# Patient Record
Sex: Female | Born: 1940 | Race: White | Hispanic: No | Marital: Married | Smoking: Former smoker
Health system: Southern US, Community
[De-identification: ages and names within clinical notes are randomized; demographics above are authoritative.]

## PROBLEM LIST (undated history)

## (undated) DIAGNOSIS — D649 Anemia, unspecified: Secondary | ICD-10-CM

## (undated) DIAGNOSIS — I10 Essential (primary) hypertension: Secondary | ICD-10-CM

## (undated) DIAGNOSIS — E039 Hypothyroidism, unspecified: Secondary | ICD-10-CM

## (undated) DIAGNOSIS — K449 Diaphragmatic hernia without obstruction or gangrene: Secondary | ICD-10-CM

## (undated) DIAGNOSIS — Z86010 Personal history of colonic polyps: Secondary | ICD-10-CM

## (undated) HISTORY — DX: Anemia, unspecified: D64.9

## (undated) HISTORY — PX: OTHER SURGICAL HISTORY: SHX169

## (undated) HISTORY — PX: BLADDER SURGERY: SHX569

## (undated) HISTORY — DX: Essential (primary) hypertension: I10

## (undated) HISTORY — DX: Personal history of colonic polyps: Z86.010

## (undated) HISTORY — DX: Diaphragmatic hernia without obstruction or gangrene: K44.9

## (undated) HISTORY — PX: FOOT SURGERY: SHX648

## (undated) HISTORY — PX: TUBAL LIGATION: SHX77

## (undated) HISTORY — DX: Hypothyroidism, unspecified: E03.9

## (undated) HISTORY — PX: COLON RESECTION: SHX5231

## (undated) HISTORY — PX: ABDOMINAL HYSTERECTOMY: SHX81

---

## 2009-05-01 HISTORY — PX: ESOPHAGOGASTRODUODENOSCOPY: SHX1529

## 2016-02-18 HISTORY — PX: COLONOSCOPY: SHX174

## 2018-08-22 ENCOUNTER — Telehealth: Payer: Self-pay | Admitting: Gastroenterology

## 2018-08-22 NOTE — Telephone Encounter (Signed)
Dr.Gupta reviewed patient's records and accepted to see patient for a virtual office visit. Left message for patient to call back and schedule an appointment.

## 2018-08-22 NOTE — Telephone Encounter (Signed)
Records will be placed on Dr.Gupta's desk for review.  °

## 2018-08-29 ENCOUNTER — Encounter: Payer: Self-pay | Admitting: *Deleted

## 2018-08-30 ENCOUNTER — Encounter: Payer: Self-pay | Admitting: Gastroenterology

## 2018-08-30 ENCOUNTER — Telehealth (INDEPENDENT_AMBULATORY_CARE_PROVIDER_SITE_OTHER): Payer: Medicare Other | Admitting: Gastroenterology

## 2018-08-30 VITALS — Ht 64.5 in | Wt 160.0 lb

## 2018-08-30 DIAGNOSIS — K449 Diaphragmatic hernia without obstruction or gangrene: Secondary | ICD-10-CM

## 2018-08-30 DIAGNOSIS — R0602 Shortness of breath: Secondary | ICD-10-CM

## 2018-08-30 DIAGNOSIS — Z862 Personal history of diseases of the blood and blood-forming organs and certain disorders involving the immune mechanism: Secondary | ICD-10-CM

## 2018-08-30 DIAGNOSIS — K922 Gastrointestinal hemorrhage, unspecified: Secondary | ICD-10-CM

## 2018-08-30 DIAGNOSIS — K219 Gastro-esophageal reflux disease without esophagitis: Secondary | ICD-10-CM

## 2018-08-30 MED ORDER — PANTOPRAZOLE SODIUM 40 MG PO TBEC: 40.0000 mg | DELAYED_RELEASE_TABLET | Freq: Every day | ORAL | 11 refills | Status: DC

## 2018-08-30 NOTE — Patient Instructions (Addendum)
Protonix 40mg  every morning ( Medications sent to your pharmacy )  Continue Zantac 150mg  po every night at bedtime ( you already have this medication)   Raise head of bed by 6" blocks  I do recommend CT of the chest with contrast.  Patient will get in touch with Dr. Andrey Campanile.  FU tele-visit in 4 weeks.  If still with problems and if she wants to pursue hiatal hernia repair, then would recommend esophageal manometry followed by surgical consultation.  I have discussed risks and benefits.  Certainly would like to avoid surgery if possible.  Thank you for choosing [facility]  Dr Chales Abrahams

## 2018-08-30 NOTE — Progress Notes (Signed)
Chief Complaint: GERD/SOB  Referring Provider:  Georganna Skeans, MD      ASSESSMENT AND PLAN;   #1. GERD with large HH. UGI series 05/2018- large HH with reflux. No volvulus. EGD 05/01/2009-large hiatal hernia, neg SB Bx (Dr Arlyce Dice at [place])  #2. SOB. Neg PFTs, cardiac stress test 02/2018. Had pul nodules in past.  #3. H/O anemia (resolved).   Plan: - Protonix 40mg  QAM. - Zantac 150mg  po QHS. - Raise HOB by 6" blocks - I do recommend CT of the chest with contrast.  Patient will get in touch with Dr. Andrey Campanile. - FU tele-visit in 4 weeks. - If still with problems and if she wants to pursue hiatal hernia repair, then would recommend esophageal manometry followed by surgical consultation for fundoplication.  I have discussed risks and benefits.  Certainly would like to avoid surgery if possible. 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  No odynophagia or dysphagia.  No weight loss.  No melena or hematochezia. Main complaint is shortness of breath especially when she walks fast.  Had normal pulmonary function tests, negative echo and stress test.  Told that it could be because of large hiatal hernia and reflux. No nausea, vomiting, odynophagia or dysphagia.  No significant diarrhea or constipation.   History of anemia in 2014 with hemoglobin of 6.5, heme positive stools.  Underwent EGD and colonoscopy at that time which was negative except for large hiatal hernia and small colonic polyps.  Did not have anemia thereafter.  Most recent hemoglobin 13.6 (jan 2020).  She has been taking iron supplements 3/week.  Past GI procedures: -Colonoscopy 02/22/2016 ([facility] Kirtland Bouchard [facility]1 small 5 mm polyp status post polypectomy, internal hemorrhoids, Bx- TA, 01/24/2013-3 small polyps, diverticulosis. Bx-hyperplastic. Past Medical History:  Diagnosis Date  . Anemia   . History of colon polyps   . Hypertension   . Hypothyroidism      Past Surgical History:  Procedure Laterality Date  . ABDOMINAL HYSTERECTOMY    . abdominal wall hernia    . BLADDER SURGERY    . COLON RESECTION    . COLONOSCOPY  02/18/2016  . ESOPHAGOGASTRODUODENOSCOPY  05/01/2009  . FOOT SURGERY    . TUBAL LIGATION      Family History  Problem Relation Age of Onset  . Ovarian cancer Mother   . Breast cancer Maternal Aunt     Social History   Tobacco Use  . Smoking status: Former Smoker    Types: Cigarettes    Last attempt to quit: 1969    Years since quitting: 51.3  . Smokeless tobacco: Never Used  Substance Use Topics  . Alcohol use: Yes    Comment: OCCASIONALLY  . Drug use: Never    Current Outpatient Medications  Medication Sig Dispense Refill  . Ascorbic Acid (VITAMIN C WITH ROSE HIPS) 1000 MG tablet Take 1,000 mg by mouth 2 (two) times daily.    Marland Kitchen atorvastatin (LIPITOR) 10 MG tablet Take 10 mg by mouth daily.    Marland Kitchen b complex vitamins tablet Take 1 tablet by mouth daily.    . Calcium-Phosphorus-Vitamin D (CITRACAL +D3 PO) Take by mouth 2 (two) times daily.    . diphenhydrAMINE (BENADRYL) 50 MG tablet Take 50 mg by mouth at bedtime as needed for sleep.     . Ferrous Sulfate (IRON) 325 (65 Fe) MG TABS Take by mouth. Three times a week    .  fluticasone (FLONASE) 50 MCG/ACT nasal spray Place 2 sprays into both nostrils daily.    Marland Kitchen. lisinopril (PRINIVIL,ZESTRIL) 40 MG tablet Take 40 mg by mouth daily.    . magnesium oxide (MAG-OX) 400 MG tablet Take 400 mg by mouth daily.    . Melatonin 300 MCG TABS Take by mouth. At bedtime    . metoprolol succinate (TOPROL-XL) 50 MG 24 hr tablet Take 50 mg by mouth daily. Take with or immediately following a meal.    . pantoprazole (PROTONIX) 40 MG tablet Take 40 mg by mouth daily.    . risedronate (ACTONEL) 150 MG tablet Take 150 mg by mouth every 30 (thirty) days. with water on empty stomach, nothing by mouth or lie down for next 30 minutes.    Marland Kitchen. thyroid (ARMOUR) 90 MG tablet Take 90 mg by  mouth daily.    . Vitamin A 2400 MCG (8000 UT) CAPS Take by mouth.    . vitamin E 400 UNIT capsule Take 400 Units by mouth daily.     No current facility-administered medications for this visit.     Allergies  Allergen Reactions  . Codeine   . Macrobid [Nitrofurantoin]   . Penicillins   . Tetracyclines & Related     Review of Systems:  Constitutional: Denies fever, chills, diaphoresis, appetite change and fatigue.  HEENT: Denies photophobia, eye pain, redness, hearing loss, ear pain, congestion, sore throat, rhinorrhea, sneezing, mouth sores, neck pain, neck stiffness and tinnitus.   Respiratory: Has SOB, DOE, No cough, chest tightness,  and wheezing.   Cardiovascular: Denies chest pain, palpitations and leg swelling.  Genitourinary: Denies dysuria, urgency, frequency, hematuria, flank pain and difficulty urinating.  Musculoskeletal: Denies myalgias, back pain, joint swelling, arthralgias and gait problem.  Skin: No rash.  Neurological: Denies dizziness, seizures, syncope, weakness, light-headedness, numbness and headaches.  Hematological: Denies adenopathy. Easy bruising, personal or family bleeding history  Psychiatric/Behavioral: No anxiety or depression     Physical Exam:    Ht 5' 4.5" (1.638 m)   Wt 160 lb (72.6 kg)   BMI 27.04 kg/m  Filed Weights   08/30/18 1002  Weight: 160 lb (72.6 kg)  Not examined since it was a tele-visit  Data Reviewed: I have personally reviewed following labs and imaging studies Normal CBC with hemoglobin 13.6, MCV 96 (January 2020) Ferritin 11.8, iron 80, saturation 23%   This service was provided via telemedicine.  The patient was located at home.  The provider was located in office.  The patient did consent to this telephone visit and is aware of possible charges through their insurance for this visit.   Time spent on call and coordination of care/review of records: 45 min   Edman Circleaj Tinsleigh Slovacek, MD 08/30/2018, 10:12 AM  Cc: Georganna SkeansWilson, Amelia, MD

## 2018-09-11 ENCOUNTER — Other Ambulatory Visit: Payer: Self-pay

## 2018-09-11 ENCOUNTER — Telehealth: Payer: Self-pay | Admitting: Gastroenterology

## 2018-09-11 DIAGNOSIS — R911 Solitary pulmonary nodule: Secondary | ICD-10-CM

## 2018-09-11 NOTE — Telephone Encounter (Signed)
Patient scheduled for CT of Chest without contrast at [facility]. 10/16/18 at 11:30 am. Left message for patient to call back.

## 2018-09-11 NOTE — Telephone Encounter (Signed)
Lets do CT of the chest (without contrast) RE: follow-up of pulmonary nodules

## 2018-09-12 ENCOUNTER — Telehealth: Payer: Self-pay

## 2018-09-12 NOTE — Progress Notes (Signed)
You have been scheduled for a CT scan of the Chest WITHOUT contrast at [facility].  [place], [place]  [address]  You are scheduled on 10/16/18 at 11:30am. You should arrive 15 minutes prior to your appointment time for registration. Please follow the written instructions below on the day of your exam:  WARNING: IF YOU ARE ALLERGIC TO IODINE/X-RAY DYE, PLEASE NOTIFY RADIOLOGY IMMEDIATELY AT [phone]! YOU WILL BE GIVEN A 13 HOUR PREMEDICATION PREP.  1) Do not eat or drink anything after 9:30am (2 hours prior to your test)   You may take any medications as prescribed with a small amount of water, if necessary. If you take any of the following medications: METFORMIN, GLUCOPHAGE, GLUCOVANCE, AVANDAMET, RIOMET, FORTAMET, ACTOPLUS MET, JANUMET, GLUMETZA or METAGLIP, you MAY be asked to HOLD this medication 48 hours AFTER the exam.  This test typically takes 30-45 minutes to complete.  If you have any questions regarding your exam or if you need to reschedule, you may call the CT department at [phone] between the hours of 8:00 am and 5:00 pm, Monday-Friday.   [facility] Dr. Lyndel Safe  ________________________________________________________________________

## 2018-09-12 NOTE — Progress Notes (Signed)
Dear Maye Hides:    You have been scheduled for a CT scan of the Chest WITHOUT contrast at [facility].  [place], [place]   [address]   You are scheduled on 10/16/18 at 11:30am. You should arrive 15 minutes prior to your appointment time for registration. Please follow the written instructions below on the day of your exam:   1) Do not eat or drink anything after 9:30am (2 hours prior to your test)  You may take any medications as prescribed with a small amount of water, if necessary. If you take any of the following medications: METFORMIN, GLUCOPHAGE, GLUCOVANCE, AVANDAMET, RIOMET, FORTAMET, ACTOPLUS MET, JANUMET, GLUMETZA or METAGLIP, you MAY be asked to HOLD this medication 48 hours AFTER the exam'  This test typically takes 30-45 minutes to complete.  If you have any questions regarding your exam or if you need to reschedule, you may call the CT department at [phone]  between the hours of 8:00 am and 5:00 pm, Monday-Friday.   [facility] Dr. Lyndel Safe ________________________________________________________________________

## 2018-09-12 NOTE — Telephone Encounter (Signed)
Called patient multiple times and left message to call back. Letter sent

## 2018-10-02 ENCOUNTER — Telehealth (INDEPENDENT_AMBULATORY_CARE_PROVIDER_SITE_OTHER): Payer: Medicare Other | Admitting: Gastroenterology

## 2018-10-02 ENCOUNTER — Other Ambulatory Visit: Payer: Self-pay

## 2018-10-02 ENCOUNTER — Encounter: Payer: Self-pay | Admitting: Gastroenterology

## 2018-10-02 VITALS — Ht 64.5 in | Wt 158.0 lb

## 2018-10-02 DIAGNOSIS — K219 Gastro-esophageal reflux disease without esophagitis: Secondary | ICD-10-CM

## 2018-10-02 DIAGNOSIS — R911 Solitary pulmonary nodule: Secondary | ICD-10-CM

## 2018-10-02 DIAGNOSIS — K449 Diaphragmatic hernia without obstruction or gangrene: Secondary | ICD-10-CM

## 2018-10-02 MED ORDER — PANTOPRAZOLE SODIUM 40 MG PO TBEC: 40.0000 mg | DELAYED_RELEASE_TABLET | Freq: Two times a day (BID) | ORAL | 4 refills | Status: DC

## 2018-10-02 NOTE — Addendum Note (Signed)
Addended by: Oliver Barre D on: 10/02/2018 02:00 PM   Modules accepted: Orders

## 2018-10-02 NOTE — Patient Instructions (Signed)
To help prevent the possible spread of infection to our patients, communities, and staff; we will be implementing the following measures:  As of now we are not allowing any visitors/family members to accompany you to any upcoming appointments with [facility]. If you have any concerns about this please contact our office to discuss prior to the appointment.   We have sent the following medications to your pharmacy for you to pick up at your convenience: Protonix 40mg  by mouth twice daily.  Raise the head of your bed up by 6 inches.  Please call our office at [phone] to set up your 3 month follow up visit.  Thank you,  Dr. Lynann Bologna

## 2018-10-02 NOTE — Progress Notes (Signed)
Chief Complaint: GERD/SOB  Referring Provider:  Georganna Skeans, Avila      ASSESSMENT AND PLAN;   #1. GERD with large HH. UGI series 05/2018- large HH with reflux. No volvulus. EGD 05/01/2009-large hiatal hernia, neg SB Bx (Dr Arlyce Dice at [place] [facility])  #2. SOB. Neg PFTs, cardiac stress test 02/2018. Had pul nodules in past.  #3. H/O anemia (resolved).   Plan: - Protonix 40mg  PO Bid (#180, 4 refills). Send to [facility]. - Raise HOB by 6" blocks - CT of the chest scheduled for 10/16/2018. - FU tele-visit in 12 weeks. - If still with problems and if she wants to pursue hiatal hernia repair, then would recommend esophageal manometry followed by surgical consultation for fundoplication.  I have discussed risks and benefits.  Certainly would like to avoid surgery if possible. 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 Has been taking omeprazole at bedtime.  Has taken twice a day Protonix with good relief.  Shortness of breath is better.  No nausea, vomiting, odynophagia or dysphagia.  No significant diarrhea or constipation.   History of anemia in 2014 with hemoglobin of 6.5, heme positive stools.  Underwent EGD and colonoscopy at that time which was negative except for large hiatal hernia and small colonic polyps.  Did not have anemia thereafter.  Most recent hemoglobin 13.6 (jan 2020).  She has been taking iron supplements 3/week.  Past GI procedures: -Colonoscopy 02/22/2016 (Dr Kirtland Bouchard at [place])-1 small 5 mm polyp status post polypectomy, internal hemorrhoids, Bx- TA, 01/24/2013-3 small polyps, diverticulosis. Bx-hyperplastic. Past Medical History:  Diagnosis Date  . Anemia   . Hiatal hernia   . History of colon polyps   . Hypertension   . Hypothyroidism     Past Surgical History:  Procedure Laterality Date  . ABDOMINAL HYSTERECTOMY    . abdominal wall hernia    . BLADDER  SURGERY    . COLON RESECTION    . COLONOSCOPY  02/18/2016  . ESOPHAGOGASTRODUODENOSCOPY  05/01/2009  . FOOT SURGERY    . TUBAL LIGATION      Family History  Problem Relation Age of Onset  . Ovarian cancer Mother   . Breast cancer Maternal Aunt   . Colon cancer Neg Hx   . Esophageal cancer Neg Hx     Social History   Tobacco Use  . Smoking status: Former Smoker    Types: Cigarettes    Last attempt to quit: 1969    Years since quitting: 51.4  . Smokeless tobacco: Never Used  Substance Use Topics  . Alcohol use: Yes    Comment: OCCASIONALLY  . Drug use: Never    Current Outpatient Medications  Medication Sig Dispense Refill  . Ascorbic Acid (VITAMIN C WITH ROSE HIPS) 1000 MG tablet Take 1,000 mg by mouth 2 (two) times daily.    Marland Kitchen atorvastatin (LIPITOR) 10 MG tablet Take 10 mg by mouth daily.    Marland Kitchen b complex vitamins tablet Take 1 tablet by mouth daily.    . Calcium-Phosphorus-Vitamin D (CITRACAL +D3 PO) Take by mouth 2 (two) times daily.    . diphenhydrAMINE (BENADRYL) 50 MG tablet Take 50 mg by mouth at bedtime as needed for allergies.     . Ferrous Sulfate (IRON) 325 (65 Fe) MG TABS Take by mouth. Three times a week    . fluticasone (  FLONASE) 50 MCG/ACT nasal spray Place 2 sprays into both nostrils daily.    Marland Kitchen. lisinopril (PRINIVIL,ZESTRIL) 40 MG tablet Take 40 mg by mouth daily.    . magnesium oxide (MAG-OX) 400 MG tablet Take 400 mg by mouth daily.    . Melatonin 10 MG TABS Take 1 tablet by mouth as needed.    . metoprolol succinate (TOPROL-XL) 50 MG 24 hr tablet Take 50 mg by mouth daily. Take with or immediately following a meal.    . pantoprazole (PROTONIX) 40 MG tablet Take 1 tablet (40 mg total) by mouth daily. 30 tablet 11  . risedronate (ACTONEL) 150 MG tablet Take 150 mg by mouth every 30 (thirty) days. with water on empty stomach, nothing by mouth or lie down for next 30 minutes.    Marland Kitchen. thyroid (ARMOUR) 90 MG tablet Take 90 mg by mouth daily. Said it was  Nature-Throid (NT Throid)    . Vitamin A 2400 MCG (8000 UT) CAPS Take 1 capsule by mouth daily.     . vitamin E 400 UNIT capsule Take 400 Units by mouth daily.     No current facility-administered medications for this visit.     Allergies  Allergen Reactions  . Codeine   . Macrobid [Nitrofurantoin]   . Penicillins   . Sulfa Antibiotics     Pt stated her mom told her she was allergic   . Tetracyclines & Related   . Cephalexin Rash    Review of Systems:  neg     Physical Exam:    Ht 5' 4.5" (1.638 m)   Wt 158 lb (71.7 kg)   BMI 26.70 kg/m  Filed Weights   10/02/18 1134  Weight: 158 lb (71.7 kg)  Not examined since it was a tele-visit  Data Reviewed: I have personally reviewed following labs and imaging studies Normal CBC with hemoglobin 13.6, MCV 96 (January 2020) Ferritin 11.8, iron 80, saturation 23%   This service was provided via telemedicine.  The patient was located at home.  The provider was located in office.  The patient did consent to this telephone visit and is aware of possible charges through their insurance for this visit.   Time spent on call and coordination of care/review of records: 10 min   Edman Circleaj Chaunice Obie, Avila 10/02/2018, 1:47 PM  Cc: Georganna SkeansWilson, Amelia, Avila

## 2018-10-16 ENCOUNTER — Other Ambulatory Visit: Payer: Self-pay

## 2018-10-16 ENCOUNTER — Ambulatory Visit (HOSPITAL_BASED_OUTPATIENT_CLINIC_OR_DEPARTMENT_OTHER)
Admission: RE | Admit: 2018-10-16 | Discharge: 2018-10-16 | Disposition: A | Discharge status: Home / Self Care | Payer: Medicare Other | Source: Ambulatory Visit | Attending: Gastroenterology | Admitting: Gastroenterology

## 2018-10-16 DIAGNOSIS — R911 Solitary pulmonary nodule: Secondary | ICD-10-CM

## 2018-10-22 ENCOUNTER — Telehealth: Payer: Self-pay | Admitting: Gastroenterology

## 2018-10-22 NOTE — Telephone Encounter (Signed)
"  CT - stable nodule. No acute abnormality  Large Hiatal Hernia Send report to family physician"

## 2018-10-22 NOTE — Telephone Encounter (Signed)
Left message for patient to call back  

## 2018-10-23 NOTE — Telephone Encounter (Signed)
Patient notified of the results .  She will discuss [facility] repair with her PCP and decide how to proceed.

## 2018-11-26 ENCOUNTER — Telehealth (INDEPENDENT_AMBULATORY_CARE_PROVIDER_SITE_OTHER): Payer: Medicare Other | Admitting: Gastroenterology

## 2018-11-26 ENCOUNTER — Encounter: Payer: Self-pay | Admitting: Gastroenterology

## 2018-11-26 ENCOUNTER — Other Ambulatory Visit: Payer: Self-pay

## 2018-11-26 VITALS — Ht 64.0 in | Wt 157.0 lb

## 2018-11-26 DIAGNOSIS — K449 Diaphragmatic hernia without obstruction or gangrene: Secondary | ICD-10-CM

## 2018-11-26 DIAGNOSIS — K219 Gastro-esophageal reflux disease without esophagitis: Secondary | ICD-10-CM

## 2018-11-26 MED ORDER — PANTOPRAZOLE SODIUM 40 MG PO TBEC: 40.0000 mg | DELAYED_RELEASE_TABLET | Freq: Two times a day (BID) | ORAL | 4 refills | Status: AC

## 2018-11-26 NOTE — Patient Instructions (Addendum)
If you are age 80 or older, your body mass index should be between 23-30. Your Body mass index is 26.95 kg/m. If this is out of the aforementioned range listed, please consider follow up with your Primary Care Provider.  If you are age 72 or younger, your body mass index should be between 19-25. Your Body mass index is 26.95 kg/m. If this is out of the aformentioned range listed, please consider follow up with your Primary Care Provider.   We have sent the following medications to your pharmacy for you to pick up at your convenience: Protonix   Raise head of bed by 6" blocks.  Recommend incentive spirometry three time daily.   Call and speak with the nurse Karl Pock, RN) in 4 weeks and give an update.   Thank you,  Dr. Jackquline Denmark

## 2018-11-26 NOTE — Progress Notes (Signed)
Chief Complaint: GERD/SOB  Referring Provider:  Georganna SkeansWilson, Amelia, MD      ASSESSMENT AND PLAN;   #1. GERD with large HH. UGI series 05/2018- large HH with reflux. No volvulus. EGD 05/01/2009-large hiatal hernia, neg SB Bx ([place] CT chest 10/2018- neg except for large hiatal hernia with compressive left lung atelectasis.  #2. SOB. Neg PFTs, cardiac stress test 02/2018. Neg CT chest 10/2018.  #3. H/O anemia (resolved).   Plan: -Continue Protonix 40mg  PO Bid (#180, 4 refills). [facility] -Raise HOB by 6" blocks -Recommend incentive spirometry 3 times a day.  She does understand.  She will call us and let us know how she is doing in 4 weeks. -I have discussed risks and benefits of hiatal hernia repair.  She would like to hold off since she is doing very well.  I have also discussed regarding potential but small risk of gastric volvulus requiring emergency surgery.  She does understand. 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 CT chest was negative except for large hiatal hernia and compressive left lung atelectasis.  Her only problem is reflux at night.  She has used wedge pillow without any significant relief.  No nausea, vomiting, odynophagia or dysphagia.  No significant diarrhea or constipation.  No weight loss.  No melena or hematochezia.   History of anemia in 2014 with hemoglobin of 6.5, heme positive stools.  Underwent EGD and colonoscopy at that time which was negative except for large hiatal hernia and small colonic polyps.  Did not have anemia thereafter.  Most recent hemoglobin 13.6 (jan 2020).  She has been taking iron supplements 3/week.  Past GI procedures: Have reviewed previous procedures. -Colonoscopy 02/22/2016 ([facility]1 small 5 mm polyp status post polypectomy, internal hemorrhoids, Bx- TA, 01/24/2013-3 small polyps, diverticulosis. Bx-hyperplastic. -E[facility] 04/2009 [facility]large hiatal hernia.  Normal Z line.Marland Kitchen. Bx-positive for H. pylori.  Treated.  Esophageal biopsies were negative for eosinophilic esophagitis or Barrett's esophagus.  Duodenal biopsies negative for celiac. Past Medical History:  Diagnosis Date  . Anemia   . Hiatal hernia   . History of colon polyps   . Hypertension   . Hypothyroidism     Past Surgical History:  Procedure Laterality Date  . ABDOMINAL HYSTERECTOMY    . abdominal wall hernia    . BLADDER SURGERY    . COLON RESECTION    . COLONOSCOPY  02/18/2016  . ESOPHAGOGASTRODUODENOSCOPY  05/01/2009  . FOOT SURGERY    . TUBAL LIGATION      Family History  Problem Relation Age of Onset  . Ovarian cancer Mother   . Breast cancer Maternal Aunt   . Colon cancer Neg Hx   . Esophageal cancer Neg Hx     Social History   Tobacco Use  . Smoking status: Former Smoker    Types: Cigarettes    Quit date: 1969    Years since quitting: 51.5  . Smokeless tobacco: Never Used  Substance Use Topics  . Alcohol use: Yes    Comment: OCCASIONALLY  . Drug use: Never    Current Outpatient Medications  Medication Sig Dispense Refill  . Ascorbic Acid (VITAMIN C WITH ROSE HIPS) 1000 MG tablet Take 1,000 mg by mouth 2 (two) times daily.    Marland Kitchen. atorvastatin (LIPITOR) 10 MG tablet Take 10 mg by mouth daily.    .Marland Kitchen  b complex vitamins tablet Take 1 tablet by mouth daily.    . Calcium-Phosphorus-Vitamin D (CITRACAL +D3 PO) Take by mouth 2 (two) times daily.    . diphenhydrAMINE (BENADRYL) 50 MG tablet Take 50 mg by mouth at bedtime as needed for allergies.     . Ferrous Sulfate (IRON) 325 (65 Fe) MG TABS Take by mouth. Three times a week    . fluticasone (FLONASE) 50 MCG/ACT nasal spray Place 2 sprays into both nostrils daily.    Marland Kitchen lisinopril (PRINIVIL,ZESTRIL) 40 MG tablet Take 40 mg by mouth daily.    . magnesium oxide (MAG-OX) 400 MG tablet Take 400 mg by mouth daily.    . Melatonin 10 MG TABS Take 1 tablet by mouth as needed.    .  metoprolol succinate (TOPROL-XL) 50 MG 24 hr tablet Take 50 mg by mouth daily. Take with or immediately following a meal.    . pantoprazole (PROTONIX) 40 MG tablet Take 1 tablet (40 mg total) by mouth 2 (two) times daily. 180 tablet 4  . risedronate (ACTONEL) 150 MG tablet Take 150 mg by mouth every 30 (thirty) days. with water on empty stomach, nothing by mouth or lie down for next 30 minutes.    Marland Kitchen thyroid (ARMOUR) 90 MG tablet Take 90 mg by mouth daily. Said it was Nature-Throid (NT Throid)    . Vitamin A 2400 MCG (8000 UT) CAPS Take 1 capsule by mouth daily.     . vitamin E 400 UNIT capsule Take 400 Units by mouth daily.     No current facility-administered medications for this visit.     Allergies  Allergen Reactions  . Codeine   . Macrobid [Nitrofurantoin]   . Penicillins   . Sulfa Antibiotics     Pt stated her mom told her she was allergic   . Tetracyclines & Related   . Cephalexin Rash    Review of Systems:  neg     Physical Exam:    Ht 5\' 4"  (1.626 m)   Wt 157 lb (71.2 kg)   BMI 26.95 kg/m  Filed Weights   11/26/18 0832  Weight: 157 lb (71.2 kg)  Not examined since it was a tele-visit  Data Reviewed: I have personally reviewed following labs and imaging studies Normal CBC with hemoglobin 13.6, MCV 96 (January 2020) Ferritin 11.8, iron 80, saturation 23%   This service was provided via telemedicine.  The patient was located at home.  The provider was located in office.  The patient did consent to this telephone visit and is aware of possible charges through their insurance for this visit.   Time spent on call and coordination of care/review of records: 10 min   Carmell Austria, MD 11/26/2018, 10:11 AM  Cc: Dorna Mai, MD

## 2019-12-31 IMAGING — CT CT CHEST WITHOUT CONTRAST
2 of 3 series · 15 of 36 positions shown, 18 images · non-contrast
Comparison: 06/19/2014 chest CT.

CLINICAL DATA: Follow-up pulmonary nodule.  Former smoker.

EXAM:
CT CHEST WITHOUT CONTRAST
TECHNIQUE: Multidetector CT imaging of the chest was performed following the
standard protocol without IV contrast.

[Series 2: thorax · axial · 0.69mm/px · z∈[-266,-24]mm · 12 of 143 slices shown, 15 images]
[im 11/143  mediastinal]
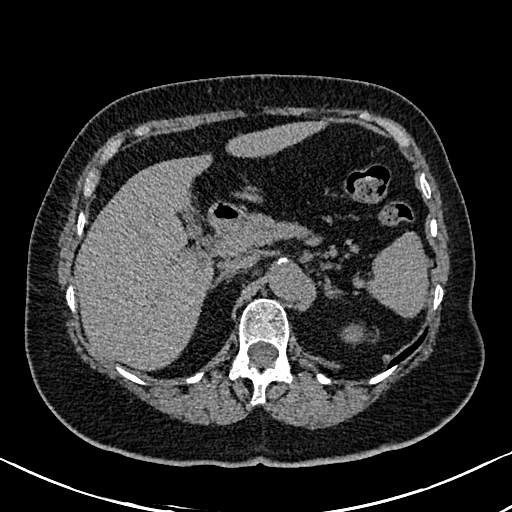
[im 11/143  lung]
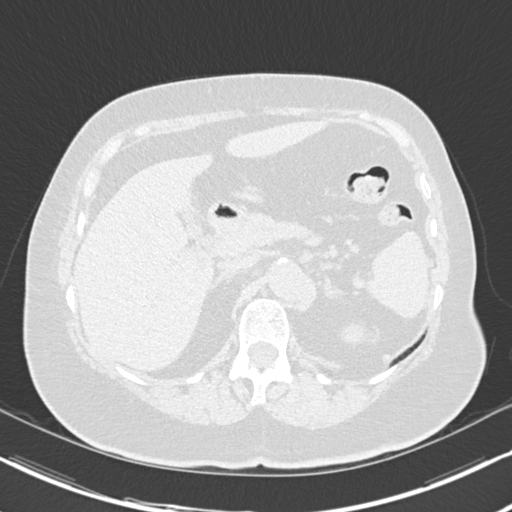
[im 22/143  lung]
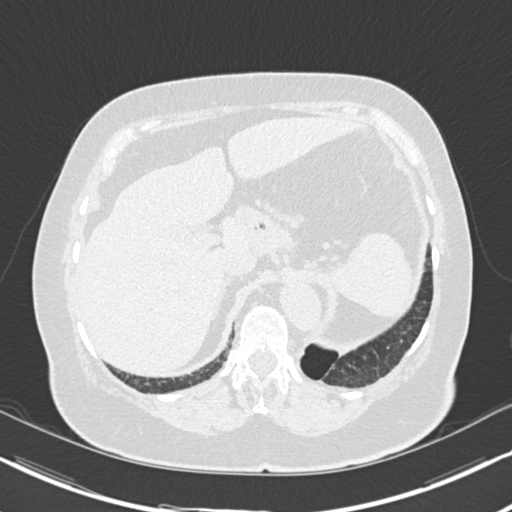
[im 32/143  lung]
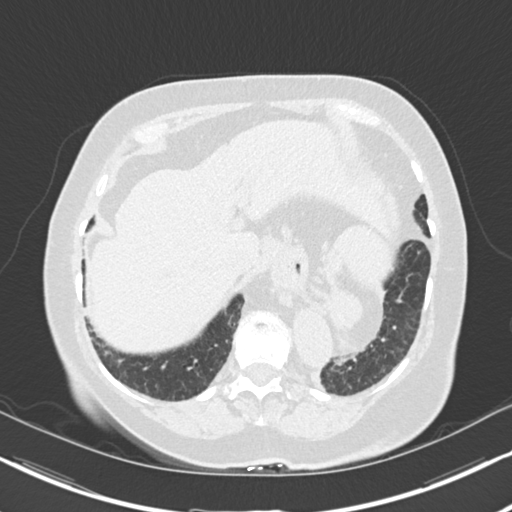
[im 43/143  lung]
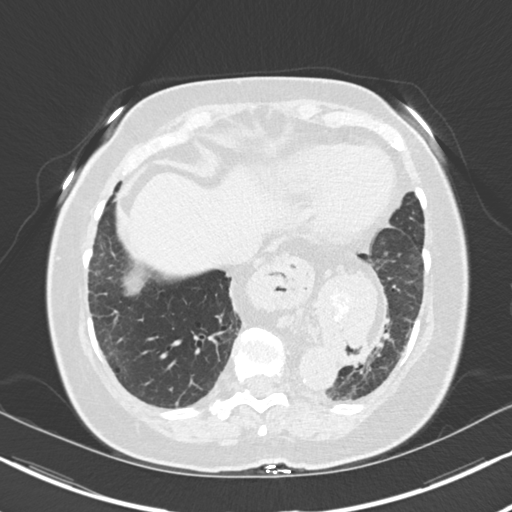
[im 53/143  mediastinal]
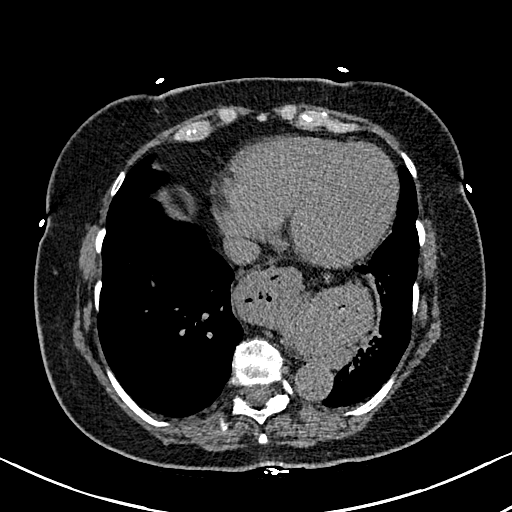
[im 53/143  lung]
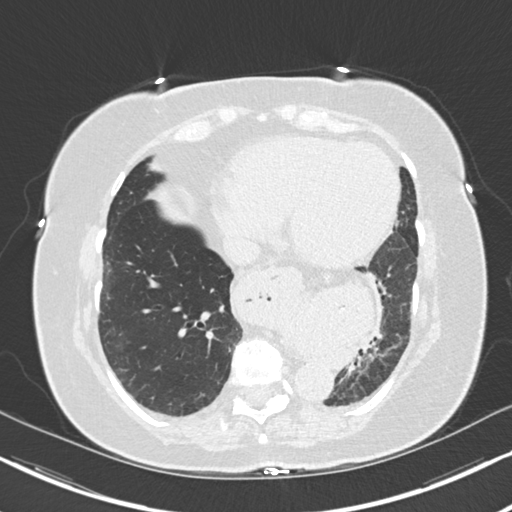
[im 64/143  lung]
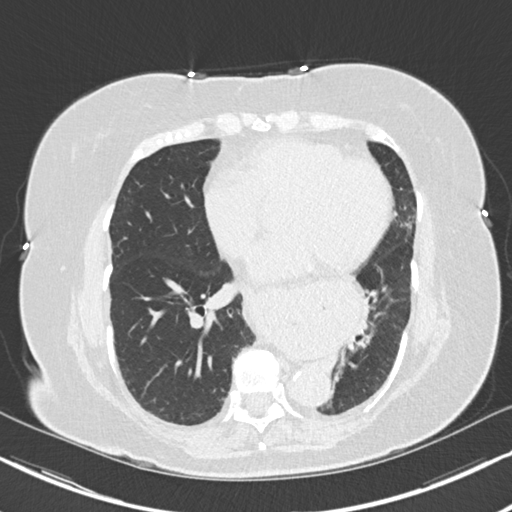
[im 79/143  lung]
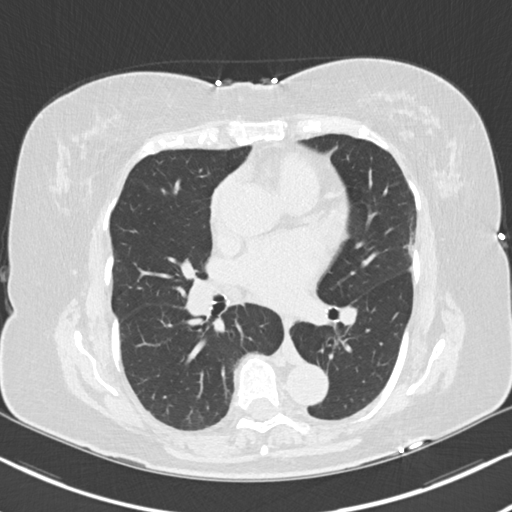
[im 90/143  lung]
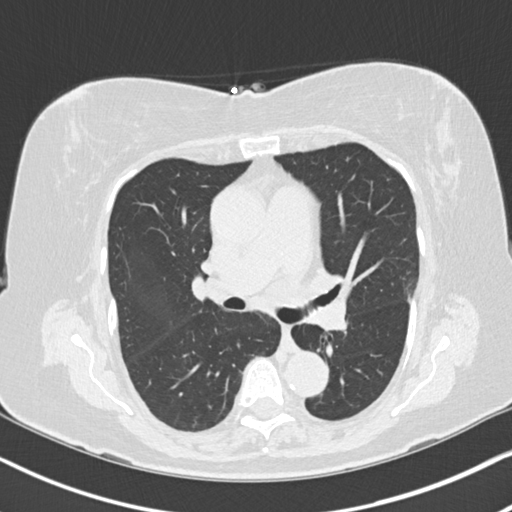
[im 100/143  mediastinal]
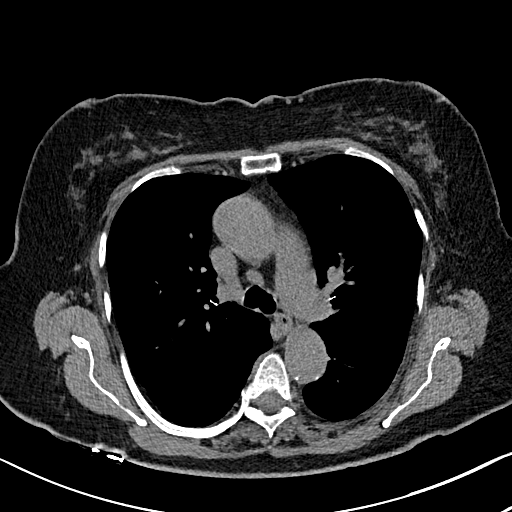
[im 100/143  lung]
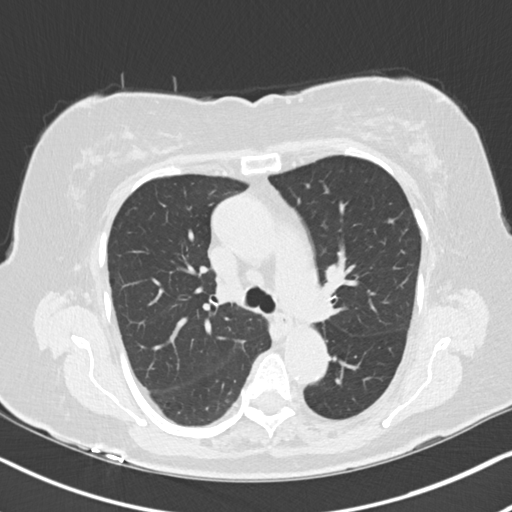
[im 111/143  lung]
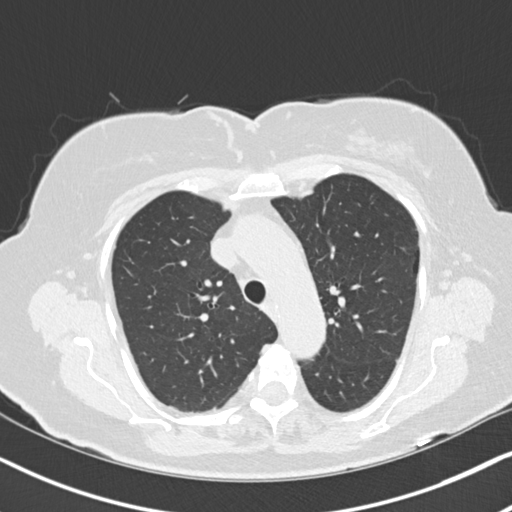
[im 121/143  lung]
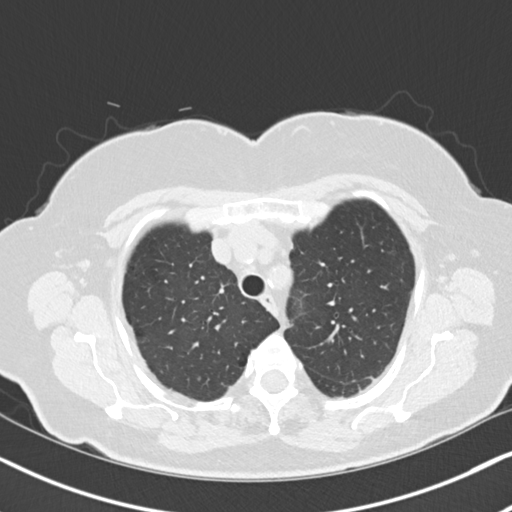
[im 132/143  lung]
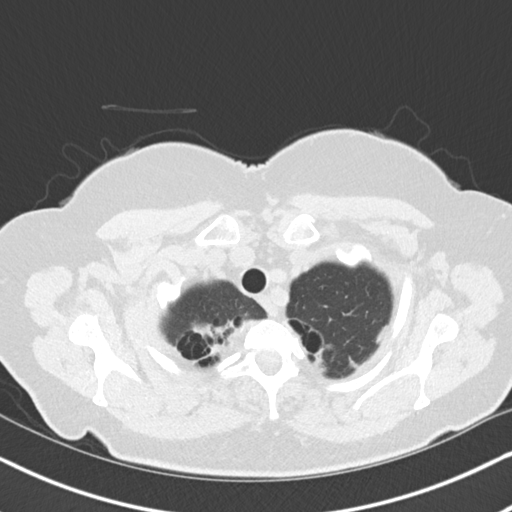

[Series 5: coronal · coronal · 0.61mm/px · 3 of 141 slices shown]
[im 29/141  lung]
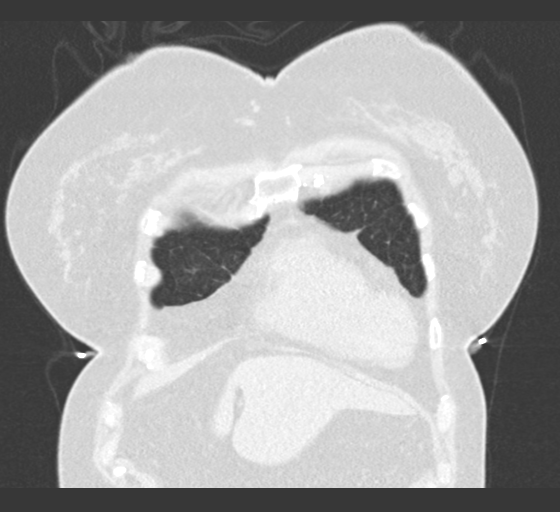
[im 57/141  lung]
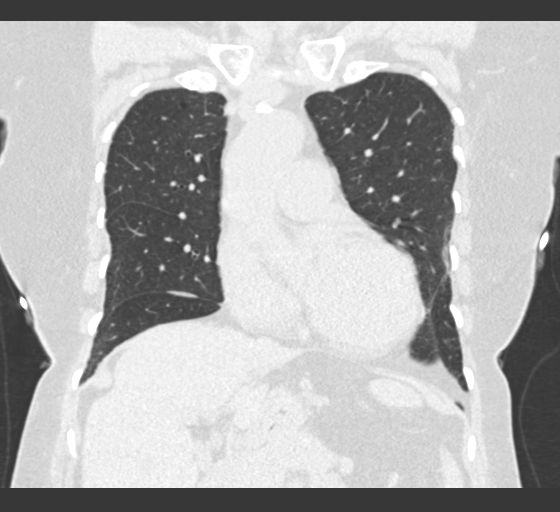
[im 85/141  lung]
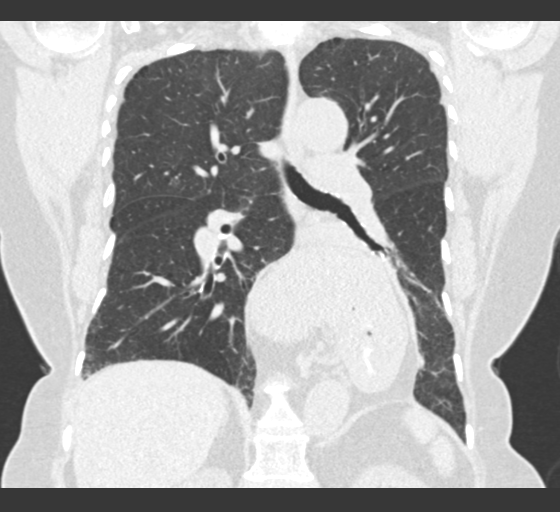

[15 of 36 positions shown; findings below may reference images not displayed]

FINDINGS: Cardiovascular: Top-normal heart size. No significant pericardial
effusion/thickening. Atherosclerotic nonaneurysmal thoracic aorta.
Stable top-normal main pulmonary artery (3.3 cm diameter).

Mediastinum/Nodes: No discrete thyroid nodules. Unremarkable
esophagus. No pathologically enlarged axillary, mediastinal or hilar
lymph nodes, noting limited sensitivity for the detection of hilar
adenopathy on this noncontrast study.

Lungs/Pleura: No pneumothorax. No pleural effusion. Mild paraseptal
and centrilobular emphysema. No acute consolidative airspace disease
or lung masses. Right middle lobe solid 4 mm dense pulmonary nodule
(series 3/image 71), stable since 2806 CT, considered benign. No new
significant pulmonary nodules. Compressive atelectasis in the medial
left lower lobe. Nonspecific patchy subpleural reticulation and
ground-glass attenuation in the lower lungs bilaterally, not
definitely changed in the interval. No frank honeycombing.

Upper abdomen: Stable large hiatal hernia. Stomach is nondistended
and otherwise normal. Simple 1.8 cm medial lower right liver cyst.

Musculoskeletal: No aggressive appearing focal osseous lesions.
Moderate thoracic spondylosis.
IMPRESSION: 1. Right middle lobe solid 4 mm pulmonary nodule, stable since 2806
chest CT, considered benign, requiring no further follow-up. No new
significant pulmonary nodularity.
2. Mild paraseptal and centrilobular emphysema.
3. Large hiatal hernia with associated compressive left lung base
atelectasis.

Aortic Atherosclerosis ([id number]) and Emphysema ([id number]).
